# Patient Record
Sex: Female | Born: 1965 | Race: White | Hispanic: No | Marital: Married | Smoking: Never smoker
Health system: Southern US, Community
[De-identification: ages and names within clinical notes are randomized; demographics above are authoritative.]

## PROBLEM LIST (undated history)

## (undated) HISTORY — PX: BREAST EXCISIONAL BIOPSY: SUR124

---

## 1998-08-02 ENCOUNTER — Other Ambulatory Visit: Admission: RE | Admit: 1998-08-02 | Discharge: 1998-08-02 | Payer: Self-pay | Admitting: Obstetrics and Gynecology

## 1999-08-15 ENCOUNTER — Other Ambulatory Visit: Admission: RE | Admit: 1999-08-15 | Discharge: 1999-08-15 | Payer: Self-pay | Admitting: Obstetrics and Gynecology

## 2000-10-01 ENCOUNTER — Other Ambulatory Visit: Admission: RE | Admit: 2000-10-01 | Discharge: 2000-10-01 | Payer: Self-pay | Admitting: Obstetrics and Gynecology

## 2001-11-04 ENCOUNTER — Other Ambulatory Visit: Admission: RE | Admit: 2001-11-04 | Discharge: 2001-11-04 | Payer: Self-pay | Admitting: Obstetrics and Gynecology

## 2002-11-17 ENCOUNTER — Other Ambulatory Visit: Admission: RE | Admit: 2002-11-17 | Discharge: 2002-11-17 | Payer: Self-pay | Admitting: Obstetrics and Gynecology

## 2004-01-27 ENCOUNTER — Other Ambulatory Visit: Admission: RE | Admit: 2004-01-27 | Discharge: 2004-01-27 | Payer: Self-pay | Admitting: Obstetrics and Gynecology

## 2004-02-03 ENCOUNTER — Ambulatory Visit (HOSPITAL_COMMUNITY): Admission: RE | Admit: 2004-02-03 | Discharge: 2004-02-03 | Payer: Self-pay | Admitting: Obstetrics and Gynecology

## 2004-03-16 ENCOUNTER — Ambulatory Visit (HOSPITAL_COMMUNITY): Admission: RE | Admit: 2004-03-16 | Discharge: 2004-03-16 | Payer: Self-pay | Admitting: Surgery

## 2004-03-16 ENCOUNTER — Encounter (INDEPENDENT_AMBULATORY_CARE_PROVIDER_SITE_OTHER): Payer: Self-pay | Admitting: *Deleted

## 2004-12-06 ENCOUNTER — Encounter (HOSPITAL_COMMUNITY): Admission: RE | Admit: 2004-12-06 | Discharge: 2005-02-05 | Payer: Self-pay | Admitting: Surgery

## 2005-03-19 ENCOUNTER — Other Ambulatory Visit: Admission: RE | Admit: 2005-03-19 | Discharge: 2005-03-19 | Payer: Self-pay | Admitting: Obstetrics and Gynecology

## 2005-10-06 ENCOUNTER — Emergency Department (HOSPITAL_COMMUNITY): Admission: EM | Admit: 2005-10-06 | Discharge: 2005-10-06 | Payer: Self-pay | Admitting: Emergency Medicine

## 2008-01-21 ENCOUNTER — Encounter: Admission: RE | Admit: 2008-01-21 | Discharge: 2008-01-21 | Payer: Self-pay | Admitting: Obstetrics and Gynecology

## 2009-01-23 ENCOUNTER — Encounter: Admission: RE | Admit: 2009-01-23 | Discharge: 2009-01-23 | Payer: Self-pay | Admitting: Obstetrics and Gynecology

## 2010-01-24 ENCOUNTER — Encounter: Admission: RE | Admit: 2010-01-24 | Discharge: 2010-01-24 | Payer: Self-pay | Admitting: Obstetrics and Gynecology

## 2010-12-21 ENCOUNTER — Other Ambulatory Visit: Payer: Self-pay | Admitting: Obstetrics and Gynecology

## 2010-12-21 DIAGNOSIS — Z1231 Encounter for screening mammogram for malignant neoplasm of breast: Secondary | ICD-10-CM

## 2011-01-29 ENCOUNTER — Ambulatory Visit
Admission: RE | Admit: 2011-01-29 | Discharge: 2011-01-29 | Disposition: A | Discharge status: Home / Self Care | Payer: Managed Care, Other (non HMO) | Source: Ambulatory Visit | Attending: Obstetrics and Gynecology | Admitting: Obstetrics and Gynecology

## 2011-01-29 DIAGNOSIS — Z1231 Encounter for screening mammogram for malignant neoplasm of breast: Secondary | ICD-10-CM

## 2012-01-06 ENCOUNTER — Other Ambulatory Visit: Payer: Self-pay | Admitting: Obstetrics and Gynecology

## 2012-01-06 DIAGNOSIS — Z1231 Encounter for screening mammogram for malignant neoplasm of breast: Secondary | ICD-10-CM

## 2012-01-30 ENCOUNTER — Ambulatory Visit
Admission: RE | Admit: 2012-01-30 | Discharge: 2012-01-30 | Disposition: A | Discharge status: Home / Self Care | Payer: Managed Care, Other (non HMO) | Source: Ambulatory Visit | Attending: Obstetrics and Gynecology | Admitting: Obstetrics and Gynecology

## 2012-01-30 DIAGNOSIS — Z1231 Encounter for screening mammogram for malignant neoplasm of breast: Secondary | ICD-10-CM

## 2012-02-18 ENCOUNTER — Encounter: Payer: Self-pay | Admitting: Cardiology

## 2012-08-14 ENCOUNTER — Other Ambulatory Visit: Payer: Self-pay | Admitting: Family Medicine

## 2012-08-14 DIAGNOSIS — E049 Nontoxic goiter, unspecified: Secondary | ICD-10-CM

## 2012-08-21 ENCOUNTER — Ambulatory Visit
Admission: RE | Admit: 2012-08-21 | Discharge: 2012-08-21 | Disposition: A | Discharge status: Home / Self Care | Payer: Managed Care, Other (non HMO) | Source: Ambulatory Visit | Attending: Family Medicine | Admitting: Family Medicine

## 2012-08-21 DIAGNOSIS — E049 Nontoxic goiter, unspecified: Secondary | ICD-10-CM

## 2013-01-13 ENCOUNTER — Other Ambulatory Visit: Payer: Self-pay

## 2013-01-13 DIAGNOSIS — Z1231 Encounter for screening mammogram for malignant neoplasm of breast: Secondary | ICD-10-CM

## 2013-02-04 ENCOUNTER — Ambulatory Visit
Admission: RE | Admit: 2013-02-04 | Discharge: 2013-02-04 | Disposition: A | Discharge status: Home / Self Care | Payer: Managed Care, Other (non HMO) | Source: Ambulatory Visit

## 2013-02-04 DIAGNOSIS — Z1231 Encounter for screening mammogram for malignant neoplasm of breast: Secondary | ICD-10-CM

## 2014-01-03 ENCOUNTER — Other Ambulatory Visit: Payer: Self-pay | Admitting: Obstetrics and Gynecology

## 2014-01-03 DIAGNOSIS — Z1231 Encounter for screening mammogram for malignant neoplasm of breast: Secondary | ICD-10-CM

## 2014-02-10 ENCOUNTER — Ambulatory Visit (INDEPENDENT_AMBULATORY_CARE_PROVIDER_SITE_OTHER): Payer: Managed Care, Other (non HMO)

## 2014-02-10 DIAGNOSIS — Z1231 Encounter for screening mammogram for malignant neoplasm of breast: Secondary | ICD-10-CM

## 2015-01-19 ENCOUNTER — Other Ambulatory Visit: Payer: Self-pay | Admitting: Obstetrics and Gynecology

## 2015-01-19 DIAGNOSIS — Z1231 Encounter for screening mammogram for malignant neoplasm of breast: Secondary | ICD-10-CM

## 2015-02-15 ENCOUNTER — Ambulatory Visit (INDEPENDENT_AMBULATORY_CARE_PROVIDER_SITE_OTHER): Payer: Managed Care, Other (non HMO)

## 2015-02-15 DIAGNOSIS — Z1231 Encounter for screening mammogram for malignant neoplasm of breast: Secondary | ICD-10-CM

## 2016-01-29 ENCOUNTER — Other Ambulatory Visit: Payer: Self-pay | Admitting: Obstetrics and Gynecology

## 2016-01-29 DIAGNOSIS — Z1231 Encounter for screening mammogram for malignant neoplasm of breast: Secondary | ICD-10-CM

## 2016-02-16 ENCOUNTER — Ambulatory Visit (INDEPENDENT_AMBULATORY_CARE_PROVIDER_SITE_OTHER): Payer: Managed Care, Other (non HMO)

## 2016-02-16 DIAGNOSIS — Z1231 Encounter for screening mammogram for malignant neoplasm of breast: Secondary | ICD-10-CM

## 2017-01-31 ENCOUNTER — Other Ambulatory Visit: Payer: Self-pay | Admitting: Obstetrics and Gynecology

## 2017-01-31 DIAGNOSIS — Z1231 Encounter for screening mammogram for malignant neoplasm of breast: Secondary | ICD-10-CM

## 2017-02-21 ENCOUNTER — Ambulatory Visit (INDEPENDENT_AMBULATORY_CARE_PROVIDER_SITE_OTHER): Payer: Managed Care, Other (non HMO)

## 2017-02-21 DIAGNOSIS — Z1231 Encounter for screening mammogram for malignant neoplasm of breast: Secondary | ICD-10-CM

## 2018-01-16 ENCOUNTER — Other Ambulatory Visit: Payer: Self-pay | Admitting: Obstetrics and Gynecology

## 2018-01-16 DIAGNOSIS — Z1231 Encounter for screening mammogram for malignant neoplasm of breast: Secondary | ICD-10-CM

## 2018-02-27 ENCOUNTER — Ambulatory Visit (INDEPENDENT_AMBULATORY_CARE_PROVIDER_SITE_OTHER): Payer: Managed Care, Other (non HMO)

## 2018-02-27 DIAGNOSIS — Z1231 Encounter for screening mammogram for malignant neoplasm of breast: Secondary | ICD-10-CM | POA: Diagnosis not present

## 2019-01-29 ENCOUNTER — Other Ambulatory Visit: Payer: Self-pay | Admitting: Obstetrics and Gynecology

## 2019-01-29 DIAGNOSIS — Z1231 Encounter for screening mammogram for malignant neoplasm of breast: Secondary | ICD-10-CM

## 2019-03-05 ENCOUNTER — Ambulatory Visit (HOSPITAL_BASED_OUTPATIENT_CLINIC_OR_DEPARTMENT_OTHER): Payer: Managed Care, Other (non HMO)

## 2019-03-24 ENCOUNTER — Other Ambulatory Visit: Payer: Self-pay

## 2019-03-24 ENCOUNTER — Ambulatory Visit (INDEPENDENT_AMBULATORY_CARE_PROVIDER_SITE_OTHER): Payer: 59

## 2019-03-24 DIAGNOSIS — Z1231 Encounter for screening mammogram for malignant neoplasm of breast: Secondary | ICD-10-CM | POA: Diagnosis not present

## 2019-10-07 ENCOUNTER — Other Ambulatory Visit: Payer: Self-pay | Admitting: Nurse Practitioner

## 2019-10-07 DIAGNOSIS — Z8262 Family history of osteoporosis: Secondary | ICD-10-CM

## 2019-10-07 DIAGNOSIS — Z780 Asymptomatic menopausal state: Secondary | ICD-10-CM

## 2019-10-08 ENCOUNTER — Other Ambulatory Visit: Payer: Self-pay | Admitting: Obstetrics and Gynecology

## 2019-10-08 DIAGNOSIS — Z1231 Encounter for screening mammogram for malignant neoplasm of breast: Secondary | ICD-10-CM

## 2020-03-08 LAB — COLOGUARD: COLOGUARD: NEGATIVE

## 2020-03-08 LAB — EXTERNAL GENERIC LAB PROCEDURE: COLOGUARD: NEGATIVE

## 2020-04-05 ENCOUNTER — Other Ambulatory Visit: Payer: Self-pay

## 2020-04-05 ENCOUNTER — Ambulatory Visit (INDEPENDENT_AMBULATORY_CARE_PROVIDER_SITE_OTHER): Payer: Federal, State, Local not specified - PPO

## 2020-04-05 DIAGNOSIS — Z1231 Encounter for screening mammogram for malignant neoplasm of breast: Secondary | ICD-10-CM | POA: Diagnosis not present

## 2020-04-05 DIAGNOSIS — Z13820 Encounter for screening for osteoporosis: Secondary | ICD-10-CM | POA: Diagnosis not present

## 2020-04-05 DIAGNOSIS — Z8262 Family history of osteoporosis: Secondary | ICD-10-CM

## 2020-04-05 DIAGNOSIS — Z780 Asymptomatic menopausal state: Secondary | ICD-10-CM

## 2020-12-14 IMAGING — MG DIGITAL SCREENING BILAT W/ TOMO W/ CAD
6 of 10 series · 6 of 30 positions shown · non-contrast
Comparison: Previous exam(s).

CLINICAL DATA: Screening.

EXAM:
DIGITAL SCREENING BILATERAL MAMMOGRAM WITH TOMO AND CAD

[R MLO synth-2D]
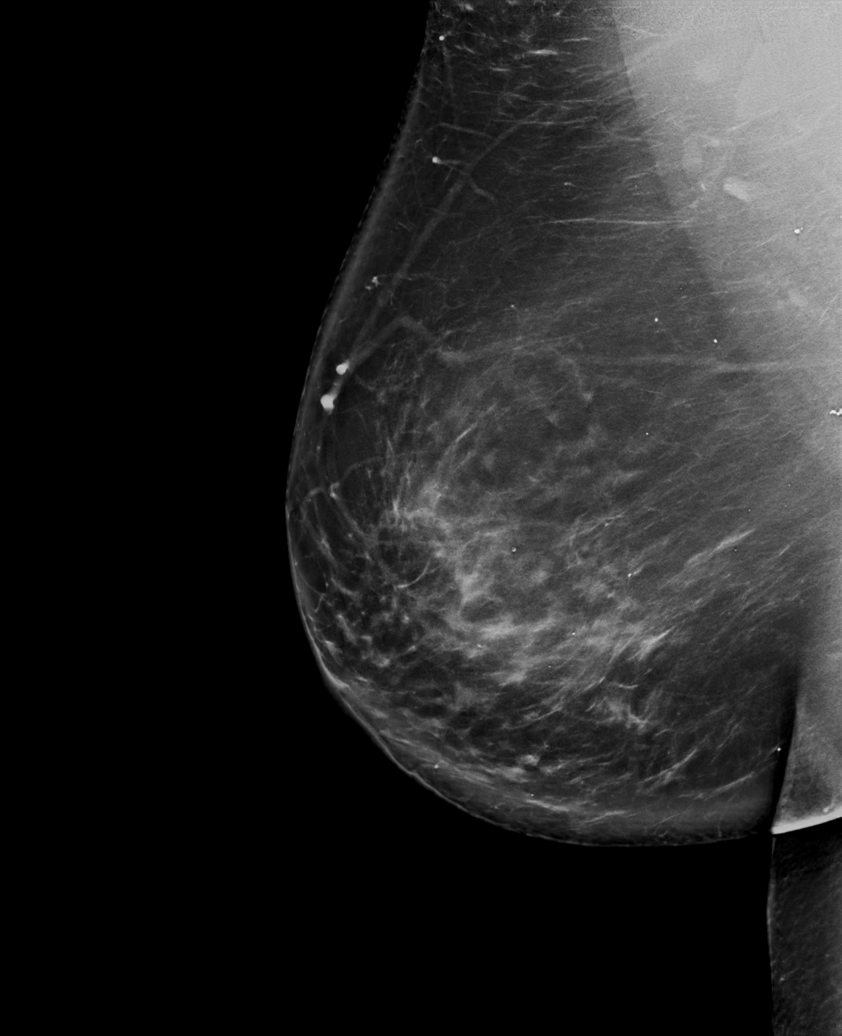

[R CC synth-2D]
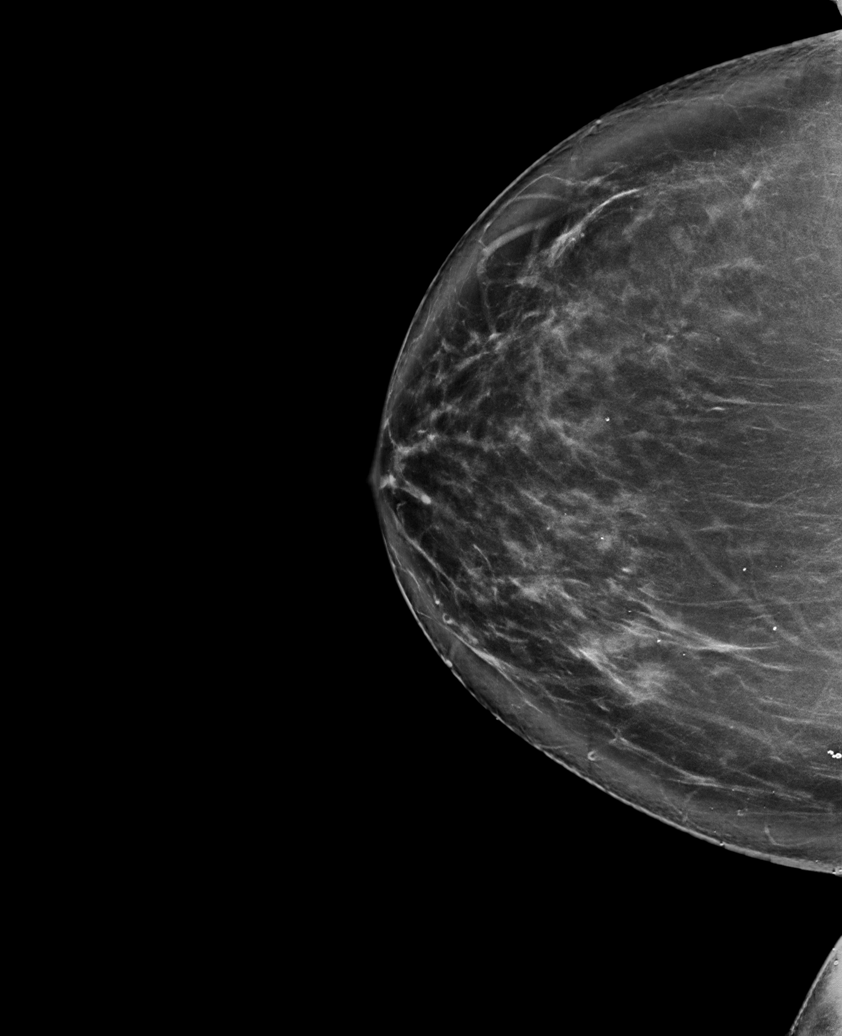

[L CC synth-2D (1 of 2)]
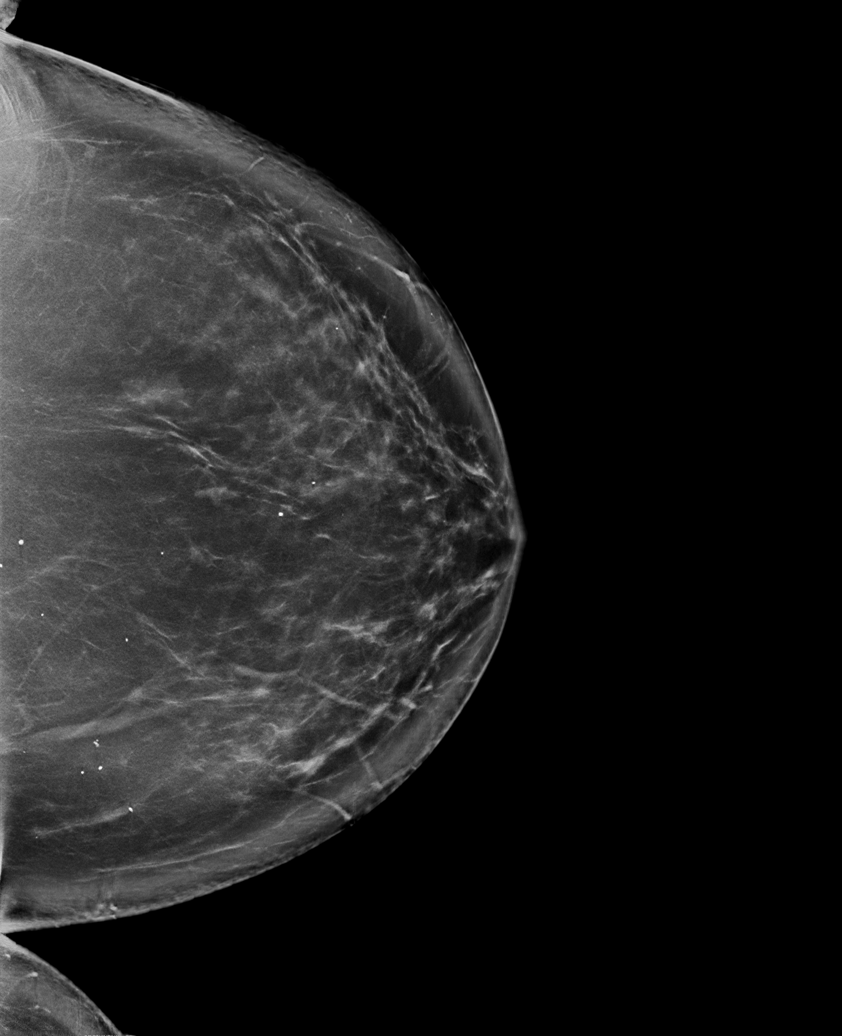

[L MLO synth-2D]
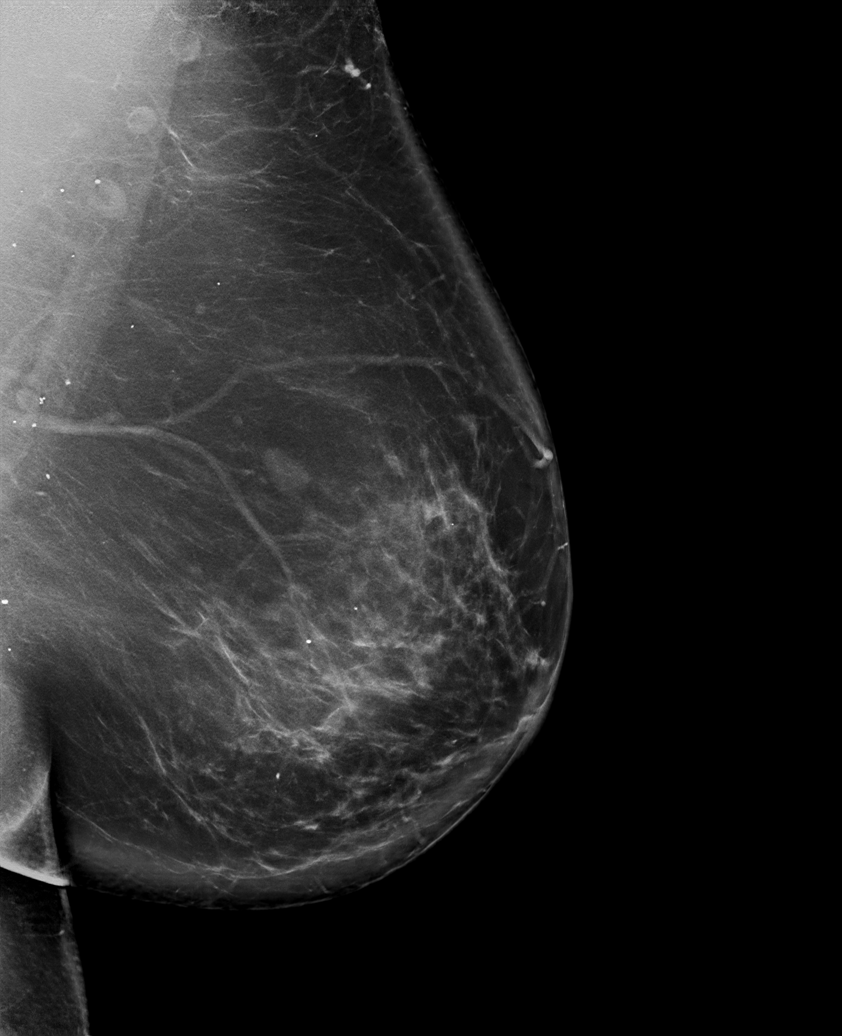

[L CC synth-2D (2 of 2)]
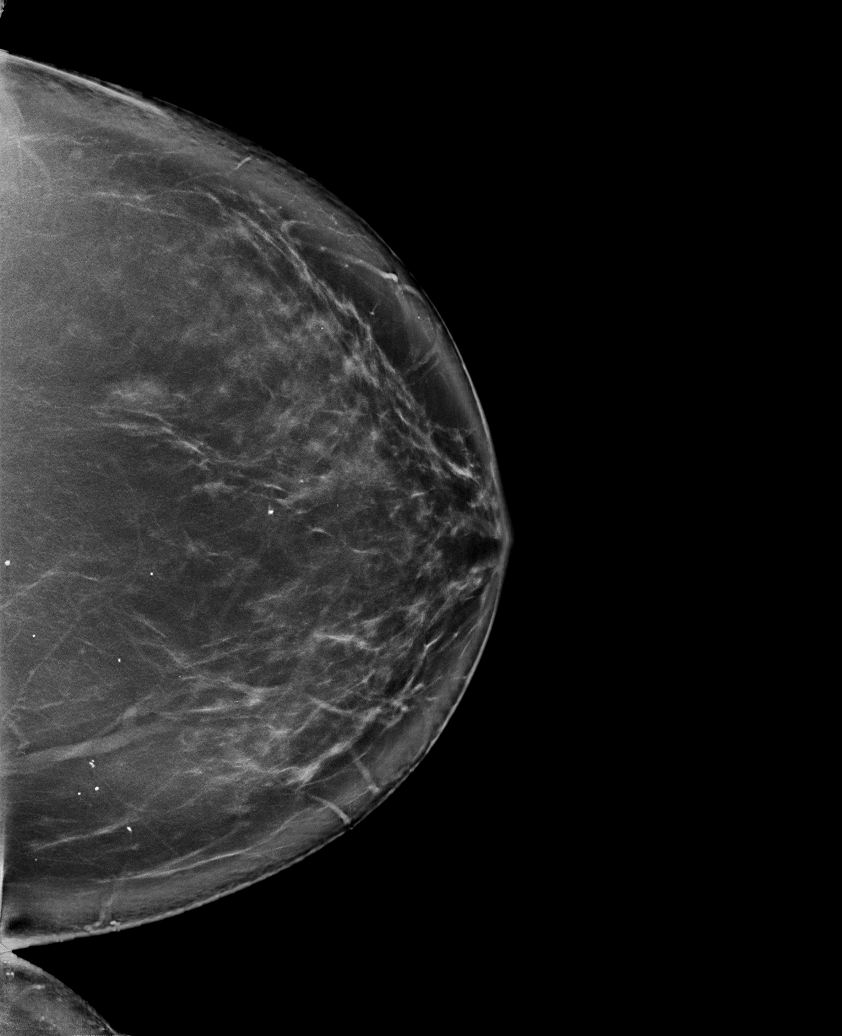

[L MLO tomo · tomo slice 59/118.0]
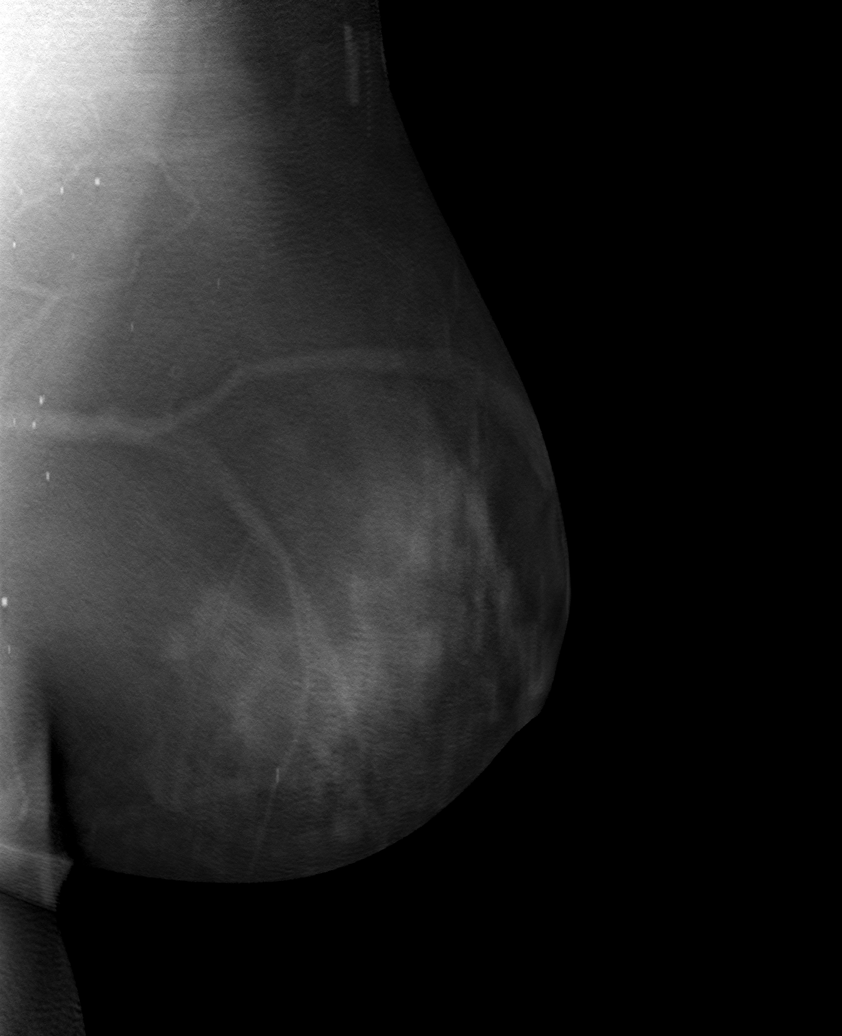

[6 of 30 positions shown; findings below may reference images not displayed]

ACR Breast Density Category b: There are scattered areas of
fibroglandular density.
FINDINGS: There are no findings suspicious for malignancy. Images were
processed with CAD.
IMPRESSION: No mammographic evidence of malignancy. A result letter of this
screening mammogram will be mailed directly to the patient.

RECOMMENDATION:
Screening mammogram in one year. (Code:[id number])

BI-RADS CATEGORY  1: Negative.

## 2021-03-02 ENCOUNTER — Other Ambulatory Visit (HOSPITAL_BASED_OUTPATIENT_CLINIC_OR_DEPARTMENT_OTHER): Payer: Self-pay | Admitting: Obstetrics and Gynecology

## 2021-03-02 ENCOUNTER — Other Ambulatory Visit: Payer: Self-pay | Admitting: Obstetrics and Gynecology

## 2021-03-02 DIAGNOSIS — Z1231 Encounter for screening mammogram for malignant neoplasm of breast: Secondary | ICD-10-CM

## 2021-04-23 ENCOUNTER — Other Ambulatory Visit: Payer: Self-pay

## 2021-04-23 ENCOUNTER — Ambulatory Visit (HOSPITAL_BASED_OUTPATIENT_CLINIC_OR_DEPARTMENT_OTHER)
Admission: RE | Admit: 2021-04-23 | Discharge: 2021-04-23 | Disposition: A | Discharge status: Home / Self Care | Payer: Federal, State, Local not specified - PPO | Source: Ambulatory Visit | Attending: Obstetrics and Gynecology | Admitting: Obstetrics and Gynecology

## 2021-04-23 DIAGNOSIS — Z1231 Encounter for screening mammogram for malignant neoplasm of breast: Secondary | ICD-10-CM | POA: Diagnosis not present

## 2022-03-21 ENCOUNTER — Other Ambulatory Visit: Payer: Self-pay | Admitting: Obstetrics and Gynecology

## 2022-03-21 DIAGNOSIS — Z1231 Encounter for screening mammogram for malignant neoplasm of breast: Secondary | ICD-10-CM

## 2022-04-24 ENCOUNTER — Ambulatory Visit (INDEPENDENT_AMBULATORY_CARE_PROVIDER_SITE_OTHER): Payer: Federal, State, Local not specified - PPO

## 2022-04-24 DIAGNOSIS — Z1231 Encounter for screening mammogram for malignant neoplasm of breast: Secondary | ICD-10-CM | POA: Diagnosis not present

## 2022-08-06 ENCOUNTER — Other Ambulatory Visit: Payer: Self-pay | Admitting: Nurse Practitioner

## 2022-08-06 DIAGNOSIS — Z13820 Encounter for screening for osteoporosis: Secondary | ICD-10-CM

## 2022-08-06 DIAGNOSIS — Z780 Asymptomatic menopausal state: Secondary | ICD-10-CM

## 2022-08-28 ENCOUNTER — Ambulatory Visit (INDEPENDENT_AMBULATORY_CARE_PROVIDER_SITE_OTHER): Payer: Federal, State, Local not specified - PPO

## 2022-08-28 DIAGNOSIS — Z780 Asymptomatic menopausal state: Secondary | ICD-10-CM

## 2022-08-28 DIAGNOSIS — Z13820 Encounter for screening for osteoporosis: Secondary | ICD-10-CM

## 2023-03-20 ENCOUNTER — Other Ambulatory Visit: Payer: Self-pay | Admitting: Nurse Practitioner

## 2023-03-20 DIAGNOSIS — Z1231 Encounter for screening mammogram for malignant neoplasm of breast: Secondary | ICD-10-CM

## 2023-05-12 ENCOUNTER — Ambulatory Visit: Payer: Federal, State, Local not specified - PPO

## 2023-05-12 DIAGNOSIS — Z1231 Encounter for screening mammogram for malignant neoplasm of breast: Secondary | ICD-10-CM

## 2023-06-12 LAB — COLOGUARD: COLOGUARD: NEGATIVE

## 2024-05-17 ENCOUNTER — Other Ambulatory Visit: Payer: Self-pay | Admitting: Nurse Practitioner

## 2024-05-17 DIAGNOSIS — Z1231 Encounter for screening mammogram for malignant neoplasm of breast: Secondary | ICD-10-CM

## 2024-06-11 ENCOUNTER — Ambulatory Visit

## 2024-06-11 DIAGNOSIS — Z1231 Encounter for screening mammogram for malignant neoplasm of breast: Secondary | ICD-10-CM | POA: Diagnosis not present
